# Patient Record
Sex: Male | Born: 1995 | Race: White | Hispanic: No | Marital: Single | Smoking: Current every day smoker
Health system: Southern US, Community
[De-identification: ages and names within clinical notes are randomized; demographics above are authoritative.]

---
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
## 2017-04-07 ENCOUNTER — Emergency Department (HOSPITAL_COMMUNITY): Payer: No Typology Code available for payment source

## 2017-04-07 ENCOUNTER — Observation Stay (HOSPITAL_COMMUNITY)
Admission: EM | Admit: 2017-04-07 | Discharge: 2017-04-08 | Disposition: A | Discharge status: Home / Self Care | Payer: No Typology Code available for payment source | Attending: Oral Surgery | Admitting: Oral Surgery

## 2017-04-07 ENCOUNTER — Encounter (HOSPITAL_COMMUNITY): Payer: Self-pay | Admitting: Emergency Medicine

## 2017-04-07 DIAGNOSIS — S2231XA Fracture of one rib, right side, initial encounter for closed fracture: Secondary | ICD-10-CM | POA: Insufficient documentation

## 2017-04-07 DIAGNOSIS — S02611A Fracture of condylar process of right mandible, initial encounter for closed fracture: Secondary | ICD-10-CM | POA: Insufficient documentation

## 2017-04-07 DIAGNOSIS — Y9344 Activity, trampolining: Secondary | ICD-10-CM | POA: Insufficient documentation

## 2017-04-07 DIAGNOSIS — S02609B Fracture of mandible, unspecified, initial encounter for open fracture: Secondary | ICD-10-CM

## 2017-04-07 DIAGNOSIS — S0292XB Unspecified fracture of facial bones, initial encounter for open fracture: Secondary | ICD-10-CM | POA: Diagnosis present

## 2017-04-07 DIAGNOSIS — S0181XA Laceration without foreign body of other part of head, initial encounter: Secondary | ICD-10-CM | POA: Insufficient documentation

## 2017-04-07 DIAGNOSIS — S02642A Fracture of ramus of left mandible, initial encounter for closed fracture: Secondary | ICD-10-CM | POA: Diagnosis not present

## 2017-04-07 DIAGNOSIS — S02651B Fracture of angle of right mandible, initial encounter for open fracture: Secondary | ICD-10-CM

## 2017-04-07 DIAGNOSIS — S0292XA Unspecified fracture of facial bones, initial encounter for closed fracture: Secondary | ICD-10-CM | POA: Diagnosis present

## 2017-04-07 DIAGNOSIS — F17290 Nicotine dependence, other tobacco product, uncomplicated: Secondary | ICD-10-CM | POA: Diagnosis not present

## 2017-04-07 DIAGNOSIS — S0266XA Fracture of symphysis of mandible, initial encounter for closed fracture: Secondary | ICD-10-CM | POA: Diagnosis not present

## 2017-04-07 DIAGNOSIS — S0269XA Fracture of mandible of other specified site, initial encounter for closed fracture: Secondary | ICD-10-CM

## 2017-04-07 LAB — BASIC METABOLIC PANEL
ANION GAP: 7 (ref 5–15)
BUN: 16 mg/dL (ref 6–20)
CHLORIDE: 101 mmol/L (ref 101–111)
CO2: 29 mmol/L (ref 22–32)
Calcium: 9.5 mg/dL (ref 8.9–10.3)
Creatinine, Ser: 1.12 mg/dL (ref 0.61–1.24)
GFR calc non Af Amer: >60 mL/min (ref >60)
GLUCOSE: 97 mg/dL (ref 65–99)
Potassium: 4.1 mmol/L (ref 3.5–5.1)
SODIUM: 137 mmol/L (ref 135–145)

## 2017-04-07 LAB — CBC WITH DIFFERENTIAL/PLATELET
BASOS ABS: 0 10*3/uL (ref 0.0–0.1)
BASOS PCT: 0 %
EOS ABS: 0 10*3/uL (ref 0.0–0.7)
EOS PCT: 0 %
HCT: 42.1 % (ref 39.0–52.0)
Hemoglobin: 14.2 g/dL (ref 13.0–17.0)
Lymphocytes Relative: 11 %
Lymphs Abs: 1.5 10*3/uL (ref 0.7–4.0)
MCH: 28.8 pg (ref 26.0–34.0)
MCHC: 33.7 g/dL (ref 30.0–36.0)
MCV: 85.4 fL (ref 78.0–100.0)
MONO ABS: 1.2 10*3/uL — AB (ref 0.1–1.0)
MONOS PCT: 9 %
NEUTROS ABS: 11 10*3/uL — AB (ref 1.7–7.7)
Neutrophils Relative %: 80 %
PLATELETS: 270 10*3/uL (ref 150–400)
RBC: 4.93 MIL/uL (ref 4.22–5.81)
RDW: 13.6 % (ref 11.5–15.5)
WBC: 13.7 10*3/uL — ABNORMAL HIGH (ref 4.0–10.5)

## 2017-04-07 MED ORDER — MORPHINE SULFATE (PF) 4 MG/ML IV SOLN
4.0000 mg | Freq: Once | INTRAVENOUS | Status: AC
  Administered 2017-04-07: 4 mg via INTRAVENOUS
  Filled 2017-04-07: qty 1

## 2017-04-07 MED ORDER — SODIUM CHLORIDE 0.9 % IV BOLUS (SEPSIS)
1000.0000 mL | Freq: Once | INTRAVENOUS | Status: AC
  Administered 2017-04-07: 1000 mL via INTRAVENOUS

## 2017-04-07 MED ORDER — OXYCODONE-ACETAMINOPHEN 5-325 MG PO TABS
ORAL_TABLET | ORAL | Status: AC
  Filled 2017-04-07: qty 1

## 2017-04-07 MED ORDER — SODIUM CHLORIDE 0.9 % IV SOLN
3.0000 g | Freq: Once | INTRAVENOUS | Status: AC
  Administered 2017-04-08: 3 g via INTRAVENOUS
  Filled 2017-04-07: qty 3

## 2017-04-07 MED ORDER — OXYCODONE-ACETAMINOPHEN 5-325 MG PO TABS
1.0000 | ORAL_TABLET | ORAL | Status: AC | PRN
  Administered 2017-04-07 – 2017-04-08 (×2): 1 via ORAL
  Filled 2017-04-07: qty 1

## 2017-04-07 NOTE — ED Triage Notes (Signed)
Reports jumping at a trampoline park today when he came down and hit a metal beam.  Lac noted to chin, pain in right jaw.  No LOC.

## 2017-04-07 NOTE — ED Provider Notes (Signed)
[facility]-EMERGENCY DEPT Provider Note   CSN: [id number] Arrival date & time: 04/07/17  1859     History   Chief Complaint Chief Complaint  Patient presents with  . Facial Laceration  . Jaw Pain    HPI John Edwards is a 21 y.o. male.  HPI   21 year old male presenting for evaluation of a recent jaw injury.  Pt was at a trampoline park with his fraternity earlier today.  Pt was doing flips from the trampoline to a foam pit when he jumped too far and landed with the chin on the hard edge of the foam pit.  Report acute onset of severe sharp pain to his chin, and transient blurry vision but no LOC.  Denies neck pain, and no other significant injury.  Did report mild muscle spasm to both shoulders.  Does report mild tenderness to R anterior chest at the sterno-clavicular joint.  Denies prior hx of head injury.  This incident happened approximately 3 hrs ago.  Does report mild headache.  Pt did received 1 percocet for pain in triage.  He is UTD with tetanus.     History reviewed. No pertinent past medical history.  There are no active problems to display for this patient.   History reviewed. No pertinent surgical history.     Home Medications    Prior to Admission medications   Not on File    Family History No family history on file.  Social History Social History  Substance Use Topics  . Smoking status: Current Every Day Smoker    Types: E-cigarettes  . Smokeless tobacco: Never Used  . Alcohol use Yes     Allergies   Patient has no allergy information on record.   Review of Systems Review of Systems  All other systems reviewed and are negative.    Physical Exam Updated Vital Signs BP (!) 146/82   Pulse 90   Temp 98.3 F (36.8 C) (Oral)   Resp 20   Ht 6' (1.829 m)   Wt 84.8 kg (187 lb)   SpO2 100%   BMI 25.36 kg/m   Physical Exam  Constitutional: He appears well-developed and well-nourished. No distress.  HENT:  Tenderness throughout entire jaw,  most significant to R jaw line, anterior chin.  2cm horizontal deep laceration to anterior chin, laceration noted to inner jaw between central incisor with loose tooth.  Trismus noted.  Difficult to fully evaluate the oral cavity due to trismus. No hemotympanum or septal hematoma. No midface tenderness.   Eyes: Conjunctivae are normal.  Neck: Neck supple.  Neurological: He is alert.  Skin: No rash noted.  Psychiatric: He has a normal mood and affect.  Nursing note and vitals reviewed.    ED Treatments / Results  Labs (all labs ordered are listed, but only abnormal results are displayed) Labs Reviewed  CBC WITH DIFFERENTIAL/PLATELET - Abnormal; Notable for the following:       Result Value   WBC 13.7 (*)    Neutro Abs 11.0 (*)    Monocytes Absolute 1.2 (*)    All other components within normal limits  BASIC METABOLIC PANEL    EKG  EKG Interpretation None       Radiology Ct Maxillofacial Wo Contrast  Result Date: 04/07/2017 CLINICAL DATA:  Per ed notes: Reports jumping at a trampoline park today when he came down and hit a metal beam. Lac noted to chin, pain in right jaw. No LOC EXAM: CT MAXILLOFACIAL WITHOUT CONTRAST  TECHNIQUE: Multidetector CT imaging of the maxillofacial structures was performed. Multiplanar CT image reconstructions were also generated. COMPARISON:  None. FINDINGS: Osseous: There is minimally displaced fracture through the symphysis of the mandible. Fracture extends from the alveolar ridge adjacent to the LEFT incisors and extends inferiorly slightly RIGHT of midline to the inferior aspect of the mandibular symphysis (images 23 through 10 of series 4). There is additional fracture through the body of the LEFT mandibular ramus approximately 2.5 cm from the inferior from the mandibular condyle. The mandibular condyle is located. RIGHT mandibular condyle is fractured and dislocated medially (image 53, series 4). Mild comminution The maxillary bone is intact. Pterygoid  plates are normal. Is no fluid in the maxillary sinuses. The orbital rims are intact. No intraconal injury. The zygomatic arches are intact. Orbits: Normal Sinuses: Normal Soft tissues: Gas within the sub lingular tissue on the RIGHT (image 8, series 3 Limited intracranial: No intracranial hemorrhage. IMPRESSION: 1. Minimally displaced fracture through the symphysis of the mandible. 2. Fracture of the RIGHT mandibular condyle with dislocation. 3. Fracture of the body of the LEFT mandibular ramus several cm below the mandibular condyle. Electronically Signed   By: Genevive Bi M.D.   On: 04/07/2017 20:07    Procedures Procedures (including critical care time)  Medications Ordered in ED Medications  oxyCODONE-acetaminophen (PERCOCET/ROXICET) 5-325 MG per tablet 1 tablet (1 tablet Oral Given 04/07/17 1924)  oxyCODONE-acetaminophen (PERCOCET/ROXICET) 5-325 MG per tablet (not administered)  sodium chloride 0.9 % bolus 1,000 mL (not administered)  morphine 4 MG/ML injection 4 mg (not administered)     Initial Impression / Assessment and Plan / ED Course  I have reviewed the triage vital signs and the nursing notes.  Pertinent labs & imaging results that were available during my care of the patient were reviewed by me and considered in my medical decision making (see chart for details).     BP 129/71   Pulse 75   Temp 98.3 F (36.8 C) (Oral)   Resp 18   Ht 6' (1.829 m)   Wt 84.8 kg (187 lb)   SpO2 100%   BMI 25.36 kg/m    Final Clinical Impressions(s) / ED Diagnoses   Final diagnoses:  Open fracture of right mandibular angle, initial encounter (HCC)  Jaw fracture, open, initial encounter [facility])    New Prescriptions Current Discharge Medication List      10:41 PM Pt here for evaluation of jaw injury after jumping off from a trampoline and struck jaw against hard surface.  CT scan showing minimally displaced fracture through the symphysis of the mandible, fracture of the R  mandibular condyle with dislocation and fracture of the body of the L mandibular ramus several cm below the mandibular condyle.    Since pt has open unstable fractures of his jaw, I have consulted oral surgeon Dr. Kenney Houseman who agrees to see pt in the ER and will admit for further surgical management.  Pt started on Unasyn as treatment for open fracture, pain medication given.  Pt made NPO.    11:13 PM Xray of R ribs demonstrate fracture of the right first rib without evidence of pneumothorax.  Definitive care for rib fracture including incentive spirometry was given.     Fayrene Helper, PA-C 04/08/17 1502    Tilden Fossa, MD 04/09/17 1440

## 2017-04-07 NOTE — H&P (Signed)
John Edwards is an 21 y.o. male.   Chief Complaint: jaw fracture/pain HPI: Patient is 21 year old male status post fall from trampoline landing on a metal bar earlier today. Denies loss of consciousness. He presented to [facility] ED for evaluation. CT scan of the maxillofacial structures was obtained showing multiple mandible fractures. Oral maxillofacial surgery was consult did for definitive care. Currently the patient complains of bilateral jaw pain. He denies nausea/vomiting. Denies dyspnea/dysphagia. Denies any cervical pain.  History reviewed. No pertinent past medical history.  History reviewed. No pertinent surgical history.  No family history on file. Social History:  reports that he has been smoking E-cigarettes.  He has never used smokeless tobacco. He reports that he drinks alcohol. He reports that he uses drugs, including Marijuana.  Allergies: No Known Allergies   (Not in a hospital admission)  Results for orders placed or performed during the hospital encounter of 04/07/17 (from the past 48 hour(s))  CBC with Differential     Status: Abnormal   Collection Time: 04/07/17  9:52 PM  Result Value Ref Range   WBC 13.7 (H) 4.0 - 10.5 K/uL   RBC 4.93 4.22 - 5.81 MIL/uL   Hemoglobin 14.2 13.0 - 17.0 g/dL   HCT 42.1 39.0 - 52.0 %   MCV 85.4 78.0 - 100.0 fL   MCH 28.8 26.0 - 34.0 pg   MCHC 33.7 30.0 - 36.0 g/dL   RDW 13.6 11.5 - 15.5 %   Platelets 270 150 - 400 K/uL   Neutrophils Relative % 80 %   Neutro Abs 11.0 (H) 1.7 - 7.7 K/uL   Lymphocytes Relative 11 %   Lymphs Abs 1.5 0.7 - 4.0 K/uL   Monocytes Relative 9 %   Monocytes Absolute 1.2 (H) 0.1 - 1.0 K/uL   Eosinophils Relative 0 %   Eosinophils Absolute 0.0 0.0 - 0.7 K/uL   Basophils Relative 0 %   Basophils Absolute 0.0 0.0 - 0.1 K/uL  Basic metabolic panel     Status: None   Collection Time: 04/07/17 10:20 PM  Result Value Ref Range   Sodium 137 135 - 145 mmol/L   Potassium 4.1 3.5 - 5.1 mmol/L   Chloride 101  101 - 111 mmol/L   CO2 29 22 - 32 mmol/L   Glucose, Bld 97 65 - 99 mg/dL   BUN 16 6 - 20 mg/dL   Creatinine, Ser 1.12 0.61 - 1.24 mg/dL   Calcium 9.5 8.9 - 10.3 mg/dL   GFR calc non Af Amer >60 >60 mL/min   GFR calc Af Amer >60 >60 mL/min    Comment: (NOTE) The eGFR has been calculated using the CKD EPI equation. This calculation has not been validated in all clinical situations. eGFR's persistently <60 mL/min signify possible Chronic Kidney Disease.    Anion gap 7 5 - 15   Dg Ribs Unilateral W/chest Right  Result Date: 04/07/2017 CLINICAL DATA:  Jumping on trampoline at trampoline park today, came down on metal beam, anterior right chest pain marked by bb on rib images. EXAM: RIGHT RIBS AND CHEST - 3+ VIEW COMPARISON:  None. FINDINGS: Normal cardiac silhouette. No pulmonary contusion or pneumothorax. Fracture of the RIGHT first rib. IMPRESSION: Fracture the RIGHT first rib.  No pneumothorax. Electronically Signed   By: Suzy Bouchard M.D.   On: 04/07/2017 22:57   Ct Maxillofacial Wo Contrast  Result Date: 04/07/2017 CLINICAL DATA:  Per ed notes: Reports jumping at a trampoline park today when he came down  and hit a metal beam. Lac noted to chin, pain in right jaw. No LOC EXAM: CT MAXILLOFACIAL WITHOUT CONTRAST TECHNIQUE: Multidetector CT imaging of the maxillofacial structures was performed. Multiplanar CT image reconstructions were also generated. COMPARISON:  None. FINDINGS: Osseous: There is minimally displaced fracture through the symphysis of the mandible. Fracture extends from the alveolar ridge adjacent to the LEFT incisors and extends inferiorly slightly RIGHT of midline to the inferior aspect of the mandibular symphysis (images 23 through 10 of series 4). There is additional fracture through the body of the LEFT mandibular ramus approximately 2.5 cm from the inferior from the mandibular condyle. The mandibular condyle is located. RIGHT mandibular condyle is fractured and dislocated  medially (image 53, series 4). Mild comminution The maxillary bone is intact. Pterygoid plates are normal. Is no fluid in the maxillary sinuses. The orbital rims are intact. No intraconal injury. The zygomatic arches are intact. Orbits: Normal Sinuses: Normal Soft tissues: Gas within the sub lingular tissue on the RIGHT (image 8, series 3 Limited intracranial: No intracranial hemorrhage. IMPRESSION: 1. Minimally displaced fracture through the symphysis of the mandible. 2. Fracture of the RIGHT mandibular condyle with dislocation. 3. Fracture of the body of the LEFT mandibular ramus several cm below the mandibular condyle. Electronically Signed   By: Suzy Bouchard M.D.   On: 04/07/2017 20:07    ROS : Other than that presented in the history of present illness, negative for 10 point review of system. Blood pressure 129/71, pulse 75, temperature 98.3 F (36.8 C), temperature source Oral, resp. rate 18, height 6' (1.829 m), weight 84.8 kg (187 lb), SpO2 100 %. Physical Exam  Gen: Awake/alert/oriented 3. No acute distress. HEENT: Pupils are equally round and reactive to light, extracted muscles are intact. He has mild bilateral mandibular edema with noted mandibular asymmetry. There is a 3 cm laceration in the mental area. There is a gross malocclusion noted with step-off defect in the anterior mandible consistent with mandible fracture and displacement. Oropharynx is clear. Mucous membranes moist. The anterior mandibular incisor slightly mobile. Hrt: rrr Lungs: cta-b Abd: s, nt, nd Neuro: Bilateral cranial nerve V3 paresthesia in the lip and chin area. Otherwise other cranial nerves are intact.   Assessment/Plan Minimally displaced open anterior mandibular symphysis fracture. Displaced fracture of the medial pole of the right condyle. Minimally displaced left subcondylar fracture.  Plan: The patient will be admitted and scheduled to be taken to the operating room and placed under general anesthesia  for open reduction internal fixation of anterior mandible fracture, closed reduction of bilateral condylar fractures with placement of arch bars and maxillomandibular fixation. Risks/benefits/alternatives been discussed with the patient in detail. The patient had an opportunity to ask questions. He will be admitted to the med surgical unit for IV fluids, antibiotics, and pain management in preparation for the OR. We'll remain nothing by mouth for now.  Michael Litter, DMD 04/07/2017, 11:53 PM

## 2017-04-08 ENCOUNTER — Inpatient Hospital Stay (HOSPITAL_COMMUNITY): Payer: No Typology Code available for payment source | Admitting: Certified Registered Nurse Anesthetist

## 2017-04-08 ENCOUNTER — Encounter (HOSPITAL_COMMUNITY)
Admission: EM | Disposition: A | Discharge status: Home / Self Care | Payer: Self-pay | Source: Home / Self Care | Attending: Oral Surgery

## 2017-04-08 ENCOUNTER — Inpatient Hospital Stay (HOSPITAL_COMMUNITY): Payer: No Typology Code available for payment source

## 2017-04-08 ENCOUNTER — Encounter (HOSPITAL_COMMUNITY): Payer: Self-pay

## 2017-04-08 DIAGNOSIS — S0292XB Unspecified fracture of facial bones, initial encounter for open fracture: Secondary | ICD-10-CM | POA: Diagnosis present

## 2017-04-08 HISTORY — PX: ORIF MANDIBULAR FRACTURE: SHX2127

## 2017-04-08 SURGERY — OPEN REDUCTION INTERNAL FIXATION (ORIF) MANDIBULAR FRACTURE
Anesthesia: General | Site: Mouth

## 2017-04-08 MED ORDER — HYDROCODONE-ACETAMINOPHEN 7.5-325 MG/15ML PO SOLN: 15.0000 mL | ORAL | 0 refills | Status: AC | PRN

## 2017-04-08 MED ORDER — LACTATED RINGERS IV SOLN
INTRAVENOUS | Status: DC
  Administered 2017-04-08: 11:00:00 via INTRAVENOUS

## 2017-04-08 MED ORDER — GELATIN ADSORBABLE OP FILM
ORAL_FILM | OPHTHALMIC | Status: AC
  Filled 2017-04-08: qty 1

## 2017-04-08 MED ORDER — PROPOFOL 10 MG/ML IV BOLUS
INTRAVENOUS | Status: DC | PRN
  Administered 2017-04-08: 200 mg via INTRAVENOUS

## 2017-04-08 MED ORDER — ONDANSETRON HCL 4 MG/2ML IJ SOLN
INTRAMUSCULAR | Status: AC
  Filled 2017-04-08: qty 2

## 2017-04-08 MED ORDER — FENTANYL CITRATE (PF) 100 MCG/2ML IJ SOLN
INTRAMUSCULAR | Status: DC | PRN
  Administered 2017-04-08: 50 ug via INTRAVENOUS
  Administered 2017-04-08: 100 ug via INTRAVENOUS
  Administered 2017-04-08 (×3): 50 ug via INTRAVENOUS

## 2017-04-08 MED ORDER — DEXMEDETOMIDINE HCL IN NACL 200 MCG/50ML IV SOLN
INTRAVENOUS | Status: AC
  Filled 2017-04-08: qty 50

## 2017-04-08 MED ORDER — PROMETHAZINE HCL 25 MG/ML IJ SOLN
6.2500 mg | INTRAMUSCULAR | Status: DC | PRN
  Administered 2017-04-08: 6.25 mg via INTRAVENOUS

## 2017-04-08 MED ORDER — LIDOCAINE-EPINEPHRINE 1 %-1:100000 IJ SOLN
INTRAMUSCULAR | Status: DC | PRN
  Administered 2017-04-08: 18 mL

## 2017-04-08 MED ORDER — BUPIVACAINE-EPINEPHRINE (PF) 0.5% -1:200000 IJ SOLN
INTRAMUSCULAR | Status: DC | PRN
  Administered 2017-04-08: 20 mL

## 2017-04-08 MED ORDER — ROCURONIUM BROMIDE 100 MG/10ML IV SOLN
INTRAVENOUS | Status: DC | PRN
  Administered 2017-04-08: 30 mg via INTRAVENOUS

## 2017-04-08 MED ORDER — DEXAMETHASONE SODIUM PHOSPHATE 10 MG/ML IJ SOLN
10.0000 mg | Freq: Three times a day (TID) | INTRAMUSCULAR | Status: DC
  Administered 2017-04-08 (×2): 10 mg via INTRAVENOUS
  Filled 2017-04-08 (×2): qty 1

## 2017-04-08 MED ORDER — SUCCINYLCHOLINE CHLORIDE 20 MG/ML IJ SOLN
INTRAMUSCULAR | Status: DC | PRN
  Administered 2017-04-08: 100 mg via INTRAVENOUS

## 2017-04-08 MED ORDER — FENTANYL CITRATE (PF) 250 MCG/5ML IJ SOLN
INTRAMUSCULAR | Status: AC
  Filled 2017-04-08: qty 5

## 2017-04-08 MED ORDER — PHENYLEPHRINE 40 MCG/ML (10ML) SYRINGE FOR IV PUSH (FOR BLOOD PRESSURE SUPPORT)
PREFILLED_SYRINGE | INTRAVENOUS | Status: DC | PRN
  Administered 2017-04-08 (×7): 80 ug via INTRAVENOUS

## 2017-04-08 MED ORDER — ONDANSETRON HCL 4 MG/2ML IJ SOLN
INTRAMUSCULAR | Status: DC | PRN
  Administered 2017-04-08: 4 mg via INTRAVENOUS

## 2017-04-08 MED ORDER — ROCURONIUM BROMIDE 10 MG/ML (PF) SYRINGE
PREFILLED_SYRINGE | INTRAVENOUS | Status: AC
  Filled 2017-04-08: qty 5

## 2017-04-08 MED ORDER — SODIUM CHLORIDE 0.9 % IV SOLN
INTRAVENOUS | Status: DC
  Administered 2017-04-08: 02:00:00 via INTRAVENOUS

## 2017-04-08 MED ORDER — HYDROMORPHONE HCL 1 MG/ML IJ SOLN
0.2500 mg | INTRAMUSCULAR | Status: DC | PRN
  Administered 2017-04-08: 0.5 mg via INTRAVENOUS

## 2017-04-08 MED ORDER — BACITRACIN ZINC 500 UNIT/GM EX OINT
TOPICAL_OINTMENT | CUTANEOUS | Status: AC
  Filled 2017-04-08: qty 28.35

## 2017-04-08 MED ORDER — BSS IO SOLN
INTRAOCULAR | Status: AC
  Filled 2017-04-08: qty 15

## 2017-04-08 MED ORDER — LIDOCAINE HCL (CARDIAC) 20 MG/ML IV SOLN
INTRAVENOUS | Status: DC | PRN
  Administered 2017-04-08: 100 mg via INTRAVENOUS

## 2017-04-08 MED ORDER — SUGAMMADEX SODIUM 200 MG/2ML IV SOLN
INTRAVENOUS | Status: AC
  Filled 2017-04-08: qty 2

## 2017-04-08 MED ORDER — MIDAZOLAM HCL 2 MG/2ML IJ SOLN
INTRAMUSCULAR | Status: AC
  Filled 2017-04-08: qty 2

## 2017-04-08 MED ORDER — HYDROMORPHONE HCL 1 MG/ML IJ SOLN
INTRAMUSCULAR | Status: AC
  Filled 2017-04-08: qty 1

## 2017-04-08 MED ORDER — AMOXICILLIN-POT CLAVULANATE 250-62.5 MG/5ML PO SUSR: 875.0000 mg | Freq: Two times a day (BID) | ORAL | 0 refills | Status: AC

## 2017-04-08 MED ORDER — PROMETHAZINE HCL 25 MG/ML IJ SOLN
INTRAMUSCULAR | Status: AC
  Filled 2017-04-08: qty 1

## 2017-04-08 MED ORDER — SUGAMMADEX SODIUM 200 MG/2ML IV SOLN
INTRAVENOUS | Status: DC | PRN
  Administered 2017-04-08: 170 mg via INTRAVENOUS

## 2017-04-08 MED ORDER — AMPICILLIN-SULBACTAM SODIUM 3 (2-1) G IJ SOLR
3.0000 g | Freq: Four times a day (QID) | INTRAMUSCULAR | Status: DC
  Administered 2017-04-08 (×2): 3 g via INTRAVENOUS
  Filled 2017-04-08 (×5): qty 3

## 2017-04-08 MED ORDER — DEXAMETHASONE SODIUM PHOSPHATE 10 MG/ML IJ SOLN
INTRAMUSCULAR | Status: AC
  Filled 2017-04-08: qty 1

## 2017-04-08 MED ORDER — CHLORHEXIDINE GLUCONATE 0.12 % MT SOLN: OROMUCOSAL | 3 refills | Status: DC

## 2017-04-08 MED ORDER — DEXMEDETOMIDINE HCL 200 MCG/2ML IV SOLN
INTRAVENOUS | Status: DC | PRN
  Administered 2017-04-08: 4 ug via INTRAVENOUS
  Administered 2017-04-08: 8 ug via INTRAVENOUS

## 2017-04-08 MED ORDER — DEXAMETHASONE SODIUM PHOSPHATE 10 MG/ML IJ SOLN
INTRAMUSCULAR | Status: DC | PRN
  Administered 2017-04-08: 10 mg via INTRAVENOUS

## 2017-04-08 MED ORDER — CHLORHEXIDINE GLUCONATE 0.12 % MT SOLN: OROMUCOSAL | 3 refills | Status: AC

## 2017-04-08 MED ORDER — ONDANSETRON HCL 4 MG/2ML IJ SOLN
4.0000 mg | Freq: Four times a day (QID) | INTRAMUSCULAR | Status: DC | PRN
  Administered 2017-04-08: 4 mg via INTRAVENOUS

## 2017-04-08 MED ORDER — LIDOCAINE-EPINEPHRINE 1 %-1:100000 IJ SOLN
INTRAMUSCULAR | Status: AC
  Filled 2017-04-08: qty 1

## 2017-04-08 MED ORDER — IBUPROFEN 600 MG PO TABS: 600.0000 mg | ORAL_TABLET | Freq: Four times a day (QID) | ORAL | 1 refills | Status: AC | PRN

## 2017-04-08 MED ORDER — MIDAZOLAM HCL 5 MG/5ML IJ SOLN
INTRAMUSCULAR | Status: DC | PRN
  Administered 2017-04-08: 2 mg via INTRAVENOUS

## 2017-04-08 MED ORDER — BUPIVACAINE-EPINEPHRINE (PF) 0.5% -1:200000 IJ SOLN
INTRAMUSCULAR | Status: AC
  Filled 2017-04-08: qty 30

## 2017-04-08 MED ORDER — SUCCINYLCHOLINE CHLORIDE 200 MG/10ML IV SOSY
PREFILLED_SYRINGE | INTRAVENOUS | Status: AC
  Filled 2017-04-08: qty 10

## 2017-04-08 MED ORDER — MORPHINE SULFATE (PF) 4 MG/ML IV SOLN
2.0000 mg | INTRAVENOUS | Status: DC | PRN
  Administered 2017-04-08: 4 mg via INTRAVENOUS
  Administered 2017-04-08: 2 mg via INTRAVENOUS
  Administered 2017-04-08 (×2): 4 mg via INTRAVENOUS
  Filled 2017-04-08 (×4): qty 1

## 2017-04-08 MED ORDER — 0.9 % SODIUM CHLORIDE (POUR BTL) OPTIME
TOPICAL | Status: DC | PRN
  Administered 2017-04-08: 1000 mL

## 2017-04-08 SURGICAL SUPPLY — 46 items
BIT DRILL RAINBOW 1.6X35 (BIT) IMPLANT
BIT DRILL TWIST 1.6X58MM (BIT) IMPLANT
BLADE SURG 15 STRL LF DISP TIS (BLADE) IMPLANT
BLADE SURG 15 STRL SS (BLADE) ×3
BURR 6MM HEAD ROUND DIAMOND (BURR) ×2 IMPLANT
CANISTER SUCT 3000ML PPV (MISCELLANEOUS) ×2 IMPLANT
CLEANER TIP ELECTROSURG 2X2 (MISCELLANEOUS) ×2 IMPLANT
DRAPE HALF SHEET 40X57 (DRAPES) ×2 IMPLANT
DRILL TWIST 1.6X58MM (BIT) ×3
DRSG TELFA 3X8 NADH (GAUZE/BANDAGES/DRESSINGS) ×3 IMPLANT
ELECT REM PT RETURN 9FT ADLT (ELECTROSURGICAL) ×3
ELECTRODE REM PT RTRN 9FT ADLT (ELECTROSURGICAL) IMPLANT
GLOVE BIO SURGEON STRL SZ 6.5 (GLOVE) ×3 IMPLANT
GLOVE BIO SURGEONS STRL SZ 6.5 (GLOVE) ×3
GLOVE BIOGEL M 7.0 STRL (GLOVE) ×2 IMPLANT
GLOVE BIOGEL PI IND STRL 7.0 (GLOVE) IMPLANT
GLOVE BIOGEL PI INDICATOR 7.0 (GLOVE) ×6
GOWN STRL REUS W/ TWL LRG LVL3 (GOWN DISPOSABLE) IMPLANT
GOWN STRL REUS W/TWL LRG LVL3 (GOWN DISPOSABLE) ×9
KIT BASIN OR (CUSTOM PROCEDURE TRAY) ×2 IMPLANT
KIT ROOM TURNOVER OR (KITS) ×2 IMPLANT
PAD ARMBOARD 7.5X6 YLW CONV (MISCELLANEOUS) ×2 IMPLANT
PAD DRESSING TELFA 3X8 NADH (GAUZE/BANDAGES/DRESSINGS) IMPLANT
PENCIL BUTTON HOLSTER BLD 10FT (ELECTRODE) ×2 IMPLANT
PLATE 6 H FRACTURE W/BAR (Plate) ×2 IMPLANT
PLATE HYBRID MMF SM (Plate) ×4 IMPLANT
SCISSORS WIRE DISP (INSTRUMENTS) ×2 IMPLANT
SCREW BONE CROSS PIN 2.0X10MM (Screw) ×2 IMPLANT
SCREW BONE CROSS PIN 2.0X12MM (Screw) ×4 IMPLANT
SCREW LOCK SELFDRIL 2.0X8M MMF (Screw) ×18 IMPLANT
SCREW LOCKING SELF DRILL 2.0X6 (Screw) ×18 IMPLANT
SCREW MNDBLE 2.3X12MM BONE (Screw) ×2 IMPLANT
SOL PREP POV-IOD 4OZ 10% (MISCELLANEOUS) ×2 IMPLANT
STAPLER VISISTAT 35W (STAPLE) IMPLANT
SURGICAL STEEL DS 26 ×4 IMPLANT
SUT CHROMIC 3 0 PS 2 (SUTURE) ×6 IMPLANT
SUT PROLENE 5 0 PS 2 (SUTURE) ×2 IMPLANT
SUT STEEL 2 (SUTURE) ×4 IMPLANT
SUT VIC AB 4-0 P-3 18X BRD (SUTURE) IMPLANT
SUT VIC AB 4-0 P3 18 (SUTURE) ×3
SYRINGE 60CC LL (MISCELLANEOUS) ×2 IMPLANT
TRAY ENT MC OR (CUSTOM PROCEDURE TRAY) ×2 IMPLANT
TUBE CONNECTING 12'X1/4 (SUCTIONS) ×1
TUBE CONNECTING 12X1/4 (SUCTIONS) ×1 IMPLANT
WATER STERILE IRR 1000ML POUR (IV SOLUTION) ×2 IMPLANT
YANKAUER SUCT BULB TIP NO VENT (SUCTIONS) ×2 IMPLANT

## 2017-04-08 NOTE — Anesthesia Procedure Notes (Signed)
Procedure Name: Intubation Date/Time: 04/08/2017 1:02 PM Performed by: Sharlene Dory E Pre-anesthesia Checklist: Patient identified, Emergency Drugs available, Suction available and Patient being monitored Patient Re-evaluated:Patient Re-evaluated prior to induction Oxygen Delivery Method: Circle system utilized Preoxygenation: Pre-oxygenation with 100% oxygen Induction Type: IV induction and Rapid sequence Laryngoscope Size: Glidescope and 4 Nasal Tubes: Right and Nasal Rae Tube size: 7.0 mm Number of attempts: 1 Placement Confirmation: ETT inserted through vocal cords under direct vision,  positive ETCO2 and breath sounds checked- equal and bilateral Tube secured with: Tape Dental Injury: Teeth and Oropharynx as per pre-operative assessment  Comments: Grade I view on Glidescope screen.

## 2017-04-08 NOTE — Transfer of Care (Signed)
Immediate Anesthesia Transfer of Care Note  Patient: John Edwards  Procedure(s) Performed: Procedure(s): OPEN REDUCTION INTERNAL FIXATION (ORIF) ANTERIOR MANDIBULAR FRACTURE,CLOSE REDUCTION OF BILATERAL CONDYLE FRACTURES, PLACEMENT OF ARCH BARS, USE OF MAXILLOMANDIBULLAR FIXATION (N/A)  Patient Location: PACU  Anesthesia Type:General  Level of Consciousness: awake and patient cooperative  Airway & Oxygen Therapy: Patient Spontanous Breathing  Post-op Assessment: Report given to RN and Post -op Vital signs reviewed and stable  Post vital signs: Reviewed and stable  Last Vitals:  Vitals:   04/08/17 0544 04/08/17 0900  BP: 122/61 (!) 130/58  Pulse: 67 88  Resp: 16 18  Temp: 36.6 C 36.8 C  SpO2: 100% 100%    Last Pain:  Vitals:   04/08/17 0900  TempSrc: Oral  PainSc:       Patients Stated Pain Goal: 3 (04/08/17 0110)  Complications: No apparent anesthesia complications

## 2017-04-08 NOTE — Progress Notes (Signed)
Received pt per stretcher. Ambulated to room. Alert and oriented. Oriented with the room and use of call light.

## 2017-04-08 NOTE — Brief Op Note (Signed)
04/07/2017 - 04/08/2017  4:05 PM  PATIENT:  John Edwards  21 y.o. male  PRE-OPERATIVE DIAGNOSIS:  MULTIPLE MANDIBLE FX'S  POST-OPERATIVE DIAGNOSIS:  MULTIPLE MANDIBLE FX'S  PROCEDURE:  Procedure(s): OPEN REDUCTION INTERNAL FIXATION (ORIF) ANTERIOR MANDIBULAR FRACTURE,CLOSE REDUCTION OF BILATERAL CONDYLE FRACTURES, PLACEMENT OF ARCH BARS, USE OF MAXILLOMANDIBULLAR FIXATION (N/A)  SURGEON:  Surgeon(s) and Role:    * Jahmiya Guidotti, Jill Alexanders, DMD - Primary    ASSISTANTS: Lequita Halt Arlege, Camila Li  ANESTHESIA:   general  EBL:  25cc IVF: 2L  BLOOD ADMINISTERED:none  DRAINS: none   LOCAL MEDICATIONS USED:  BUPIVICAINE   SPECIMEN:  No Specimen  DISPOSITION OF SPECIMEN:  N/A  COUNTS:  YES  TOURNIQUET:  * No tourniquets in log *  DICTATION: .Dragon Dictation  PLAN OF CARE: Discharge to home after PACU  PATIENT DISPOSITION:  PACU - hemodynamically stable. Patient's jaws are wired shut.   Delay start of Pharmacological VTE agent (>24hrs) due to surgical blood loss or risk of bleeding: not applicable

## 2017-04-08 NOTE — Discharge Instructions (Signed)
1. Soft, non-chew pureed diet until further notice 2. Keep head elevated for first 3 nights 3. Brush teeth/arch bars/wires as normal 4. Clean chin wound twice daily with hydrogen peroxide on q-tip. Then coat with bacitracin ointment to keep moist. Sutures will be removed in 1 wk.

## 2017-04-08 NOTE — Op Note (Signed)
PATIENT:  John Edwards  21 y.o. male  PRE-OPERATIVE DIAGNOSIS:  MULTIPLE MANDIBLE FX'S  POST-OPERATIVE DIAGNOSIS:  MULTIPLE MANDIBLE FX'S  PROCEDURE:  Procedure(s): OPEN REDUCTION INTERNAL FIXATION (ORIF) ANTERIOR MANDIBULAR FRACTURE,CLOSE REDUCTION OF BILATERAL CONDYLE FRACTURES, PLACEMENT OF ARCH BARS, USE OF MAXILLOMANDIBULLAR FIXATION (N/A) Repair of complex 3cm chin laceration  SURGEON:  Surgeon(s) and Role:    * Liliana Dang, Jill Alexanders, DMD - Primary    ASSISTANTS: Madaline Savage, Camila Li  ANESTHESIA:   general  EBL:  25cc IVF: 2L  Complications: none  Findings: Mental nerve identified bilaterally during anterior dissection was intact and protected. Adequate reduction of anterior mandible fracture. Occlusion was stable in maxillomandibular fixation. Adequate overjet and overbite.  Procedure: The patient was identified in the preoperative holding by both anesthesia and the maxilla facial teams. Health history was reviewed. Consent was verified. The patient brought back to the operative room and placed in a table supine position standard ASA leads and monitors were placed. The patient was preoxygenated, induced, and his airway was protected with a nasoendotracheal tube. The tube was taped and secured by the anesthesia care team. A throat pack was placed. The patient was then prepped and draped for a maxilla facial procedure.  The Stryker hybrid arch bar was adapted to the maxillary arch and fixated in place with multiple 8 mm length Stryker screws. Next  Attention was then directed to the anterior mandible were standard genioplasty incision was performed in the mandibular labial vestibule. The incision was carried through the mucosa. Blunt dissection was carried out with the Metzenbaum scissors down to the periosteum. The periosteum was incised with a 15 blade. Subperiosteal dissection was carried out to the inferior border of the mandible and to expose the symphysis fracture. The  fracture line was debrided and the segments were mobilized. The site was thoroughly irrigated.  Next, the Stryker hybrid arch bar was then adapted to the mandibular arch. With aid of a bone clamp to reduce the anterior mandible fracture arch bar was then fixated in place with multiple 6 mm length Stryker screws. The left central incisor was partially mobile and was repositioned within the socket. Following this there is no appreciable step-off defect in the occlusion. The patient was then guided in placed into maxillomandibular fixation with wire loops. Next a 4 hole Stryker radicular trauma plate was adapted to the inferior border in the anterior mandible. The plate was over contoured to allow for compression. The plate was then fixated with multiple bicortical Stryker screws. The fixation was tested and noted to be stable. The patient was then released from maximum mandibular fixation. The occlusion was noted to be stable and reproducible. The anterior mandibular wound was then thoroughly irrigated with copious amounts of saline. The mentalis muscle was then reapproximated with multiple 4-0 Vicryl sutures. The mucosa was then closed with interrupted 3-0 chromic gut suture placed in continuous interlocking fashion. Of note, prior to mucosal closure the midline was reapproximated with a single 4-0 Vicryl suture.  At this point attention was directed to the's 3 cm chin laceration which went through and through to the inferior border the mandible. The wound was thoroughly irrigated and debrided. The periosteum was closed with interrupted 3-0 Vicryl sutures. The deep layers were reapproximated with multiple 4-0 Vicryl sutures. The skin was then reapproximated with 5-0 Prolene suture placed in a continuous fashion.  At this point the mouth was thoroughly irrigated and suctioned. Throat pack was removed. The patient was then placed again back into  maximum mandibular fixation with 4 wire loops. The occlusion  appeared stable. The extraoral wound was then cleansed and covered with a thin coat of bacitracin and Telfa dressing. The patient was then returned to the anesthesia care team where he was extubated without event. He was transported to the postanesthesia care for recovery. He'll be discharged home once meeting appropriate discharge criteria.

## 2017-04-08 NOTE — Anesthesia Postprocedure Evaluation (Signed)
Anesthesia Post Note  Patient: John Edwards  Procedure(s) Performed: Procedure(s) (LRB): OPEN REDUCTION INTERNAL FIXATION (ORIF) ANTERIOR MANDIBULAR FRACTURE,CLOSE REDUCTION OF BILATERAL CONDYLE FRACTURES, PLACEMENT OF ARCH BARS, USE OF MAXILLOMANDIBULLAR FIXATION (N/A)     Patient location during evaluation: PACU Anesthesia Type: General Level of consciousness: awake and alert Pain management: pain level controlled Vital Signs Assessment: post-procedure vital signs reviewed and stable Respiratory status: spontaneous breathing, nonlabored ventilation, respiratory function stable and patient connected to nasal cannula oxygen Cardiovascular status: blood pressure returned to baseline and stable Postop Assessment: no apparent nausea or vomiting Anesthetic complications: no    Last Vitals:  Vitals:   04/08/17 1745 04/08/17 1750  BP: 118/63 118/63  Pulse: 76 78  Resp: 13 13  Temp:  36.7 C  SpO2: 96% 95%    Last Pain:  Vitals:   04/08/17 1750  TempSrc:   PainSc: 6                  Kennieth Rad

## 2017-04-08 NOTE — Discharge Summary (Signed)
Physician Discharge Summary  Patient ID: John Edwards MRN: [id number] DOB/AGE: 09/11/1995 21 y.o.  Admit date: 04/07/2017 Discharge date: 04/08/2017  Admission Diagnoses: Mandibular fractures  Discharge Diagnoses:  Active Problems:   Multiple facial fractures, open, initial encounter [facility]) s/p repair of mandible fractures and chin laceration  Discharged Condition: good  Hospital Course: The patient was admitted due to multiple mandibular fractures. On 9/22, the patient underwent ORIF of mandibular symphysis fracture and closed reduction of bilateral condylar fractures with placement of arch bars and use of maxillomandibular fixation in the OR under GA. The patient awoke from GA and was extubated without complication. The patient was recovered in the PACU and transferred to the floor for initial postop monitoring. The pt did not have a fever with VSS, and pain was adequately controlled. The patient was discharged to home in good condition.  Consults: None  Significant Diagnostic Studies: radiology: CT scan: maxillofacial  Treatments: surgery: ORIF mand symphysis fx, CR bil condylar fx's. Repair of chin laceration.  Discharge Exam: Blood pressure 132/63, pulse 90, temperature 98.2 F (36.8 C), temperature source Axillary, resp. rate 16, height 6' (1.829 m), weight 84.8 kg (187 lb), SpO2 100 %. General appearance: alert and no distress  HEENT: occlusion stable, wounds intact  Disposition:   Discharge Instructions    Increase activity slowly    Complete by:  As directed      Allergies as of 04/08/2017   No Known Allergies     Medication List    STOP taking these medications   omega-3 acid ethyl esters 1 g capsule Commonly known as:  LOVAZA   OVER THE COUNTER MEDICATION   OVER THE COUNTER MEDICATION   TURMERIC PO   vitamin B-12 100 MCG tablet Commonly known as:  CYANOCOBALAMIN     TAKE these medications   amoxicillin-clavulanate 250-62.5 MG/5ML suspension Commonly  known as:  AUGMENTIN Take 17.5 mLs (875 mg total) by mouth 2 (two) times daily.   chlorhexidine 0.12 % solution Commonly known as:  PERIDEX Rinse with 15mL po tid for 2 minute duration. Expectorate after use.   HYDROcodone-acetaminophen 7.5-325 mg/15 ml solution Commonly known as:  HYCET Take 15 mLs by mouth every 4 (four) hours as needed for moderate pain or severe pain.   ibuprofen 600 MG tablet Commonly known as:  ADVIL,MOTRIN Take 1 tablet (600 mg total) by mouth every 6 (six) hours as needed for mild pain or moderate pain. Elixir substitution permitted.            Discharge Care Instructions        Start     Ordered   04/08/17 0000  Increase activity slowly   Soft, non-chew pureed diet until further notice   04/08/17 1633   04/08/17 0000  amoxicillin-clavulanate (AUGMENTIN) 250-62.5 MG/5ML suspension  2 times daily     04/08/17 1633   04/08/17 0000  ibuprofen (ADVIL,MOTRIN) 600 MG tablet  Every 6 hours PRN     04/08/17 1633   04/08/17 0000  HYDROcodone-acetaminophen (HYCET) 7.5-325 mg/15 ml solution  Every 4 hours PRN     04/08/17 1633   04/08/17 0000  chlorhexidine (PERIDEX) 0.12 % solution     04/08/17 1640     Follow-up Information    Huriel Matt, Jill Alexanders, DMD. Schedule an appointment as soon as possible for a visit in 1 week.   Specialty:  Dentistry Why:  For suture removal, For wound re-check Contact information: [address] [place] [address] [phone]  Signed: Vivia Ewing, DMD Oral & Maxillofacial Surgery 04/08/2017, 7:53 PM

## 2017-04-08 NOTE — Anesthesia Preprocedure Evaluation (Addendum)
Anesthesia Evaluation  Patient identified by MRN, date of birth, ID band Patient awake    Reviewed: Allergy & Precautions, NPO status , Patient's Chart, lab work & pertinent test results  Airway Mallampati: III     Mouth opening: Limited Mouth Opening  Dental  (+) Dental Advisory Given, Loose, Teeth Intact   Pulmonary Current Smoker,    Pulmonary exam normal        Cardiovascular negative cardio ROS   Rhythm:Regular Rate:Normal     Neuro/Psych negative neurological ROS     GI/Hepatic negative GI ROS, Neg liver ROS,   Endo/Other  negative endocrine ROS  Renal/GU negative Renal ROS     Musculoskeletal   Abdominal   Peds  Hematology negative hematology ROS (+)   Anesthesia Other Findings   Reproductive/Obstetrics                            Lab Results  Component Value Date   WBC 13.7 (H) 04/07/2017   HGB 14.2 04/07/2017   HCT 42.1 04/07/2017   MCV 85.4 04/07/2017   PLT 270 04/07/2017   Lab Results  Component Value Date   CREATININE 1.12 04/07/2017   BUN 16 04/07/2017   NA 137 04/07/2017   K 4.1 04/07/2017   CL 101 04/07/2017   CO2 29 04/07/2017    Anesthesia Physical Anesthesia Plan  ASA: I  Anesthesia Plan: General   Post-op Pain Management:    Induction: Intravenous  PONV Risk Score and Plan: 1 and Ondansetron, Dexamethasone and Treatment may vary due to age or medical condition  Airway Management Planned: Nasal ETT  Additional Equipment:   Intra-op Plan:   Post-operative Plan: Extubation in OR  Informed Consent: I have reviewed the patients History and Physical, chart, labs and discussed the procedure including the risks, benefits and alternatives for the proposed anesthesia with the patient or authorized representative who has indicated his/her understanding and acceptance.   Dental advisory given  Plan Discussed with: CRNA  Anesthesia Plan Comments:         Anesthesia Quick Evaluation

## 2017-04-08 NOTE — Progress Notes (Signed)
D/c home per orders pt should hove d/c from PACU.  VSS pain denied. RX and D/C instructions given and explained with understanding. Dr Kenney Houseman gave verbal order okay to go home without voiding.

## 2017-04-10 ENCOUNTER — Encounter (HOSPITAL_COMMUNITY): Payer: Self-pay | Admitting: Oral Surgery

## 2018-08-22 IMAGING — CT CT 3D ACQUISTION [facility]KST
3 series · 15 of 47 positions shown, 18 images · non-contrast
Comparison: none

CLINICAL DATA: Nonspecific (abnormal) findings on radiological and
other examination of musculoskeletal sysem. Mandibular fractures.

EXAM:
3-DIMENSIONAL CT IMAGE RENDERING ON ACQUISITION WORKSTATION
TECHNIQUE: 3-dimensional CT images were rendered by post-processing of the
original CT data on an acquisition workstation. The 3-dimensional CT
images were interpreted and findings were reported in the
accompanying complete CT report for this study

[Series 3: facial/ orbits 2.0 h30s · axial · 0.43mm/px · z∈[-162,-10]mm · 9 of 90 slices shown, 12 images]
[im 7/90  brain]
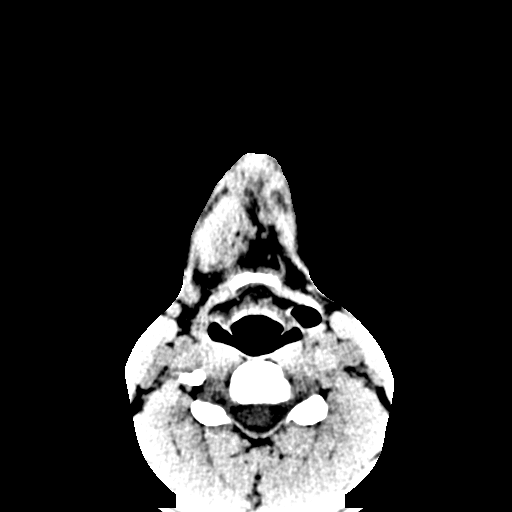
[im 7/90  bone]
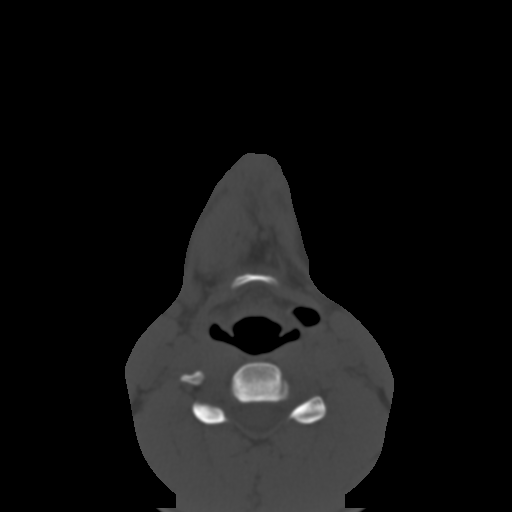
[im 16/90  brain]
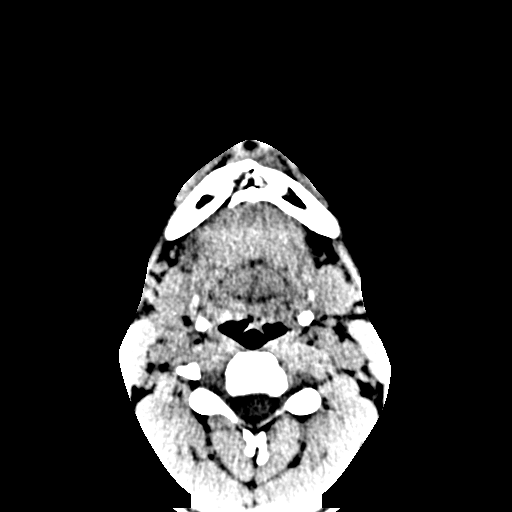
[im 25/90  brain]
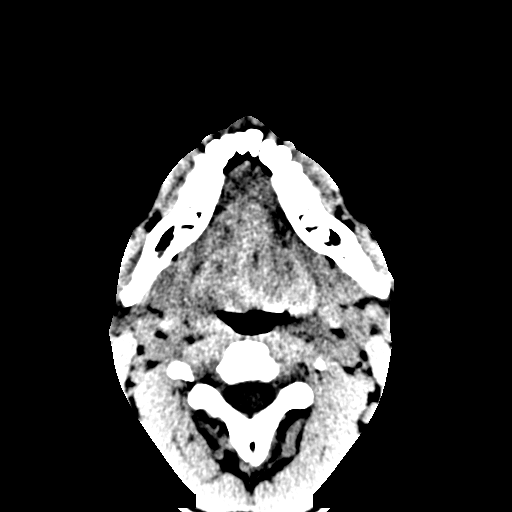
[im 34/90  brain]
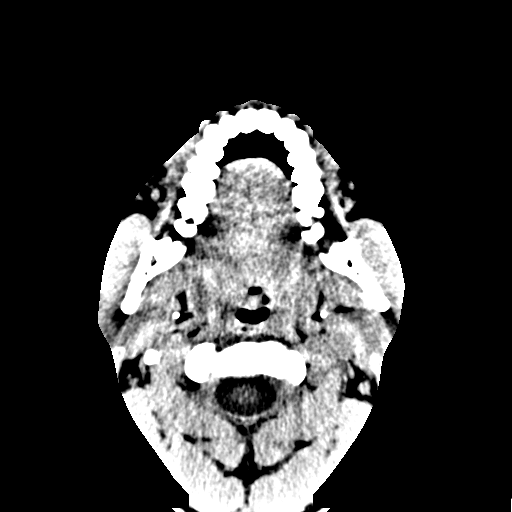
[im 47/90  brain]
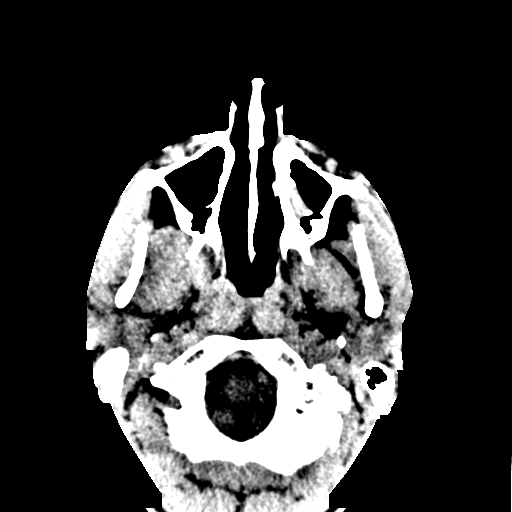
[im 47/90  bone]
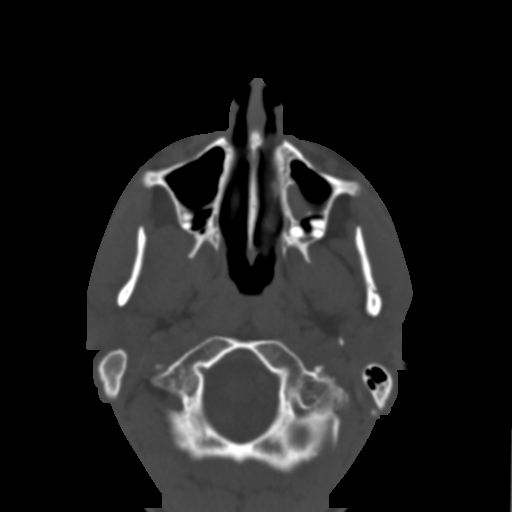
[im 56/90  brain]
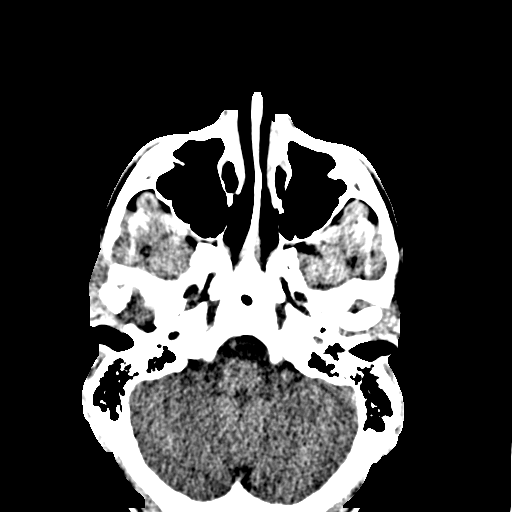
[im 65/90  brain]
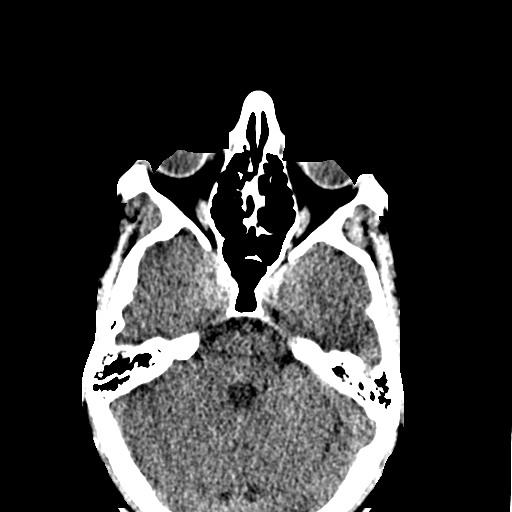
[im 74/90  brain]
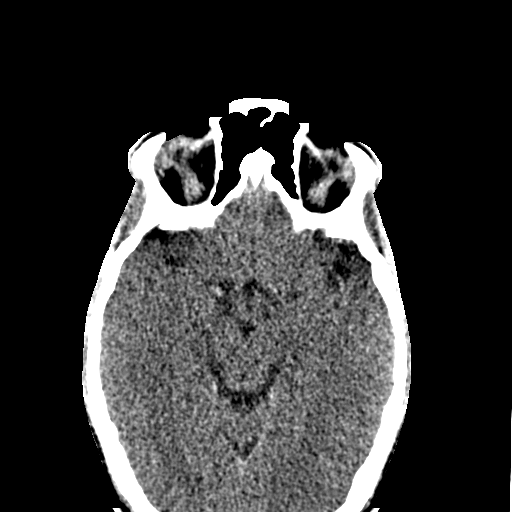
[im 83/90  brain]
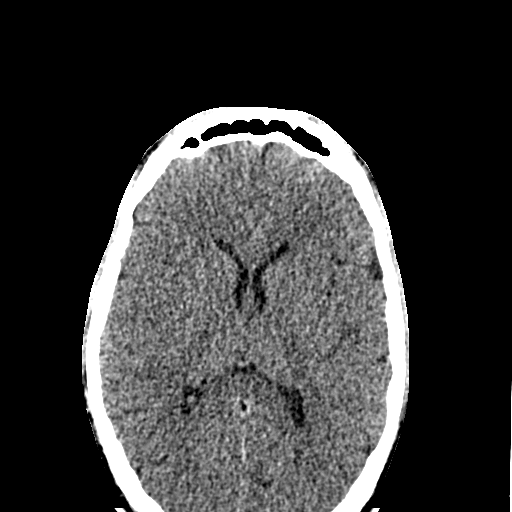
[im 83/90  bone]
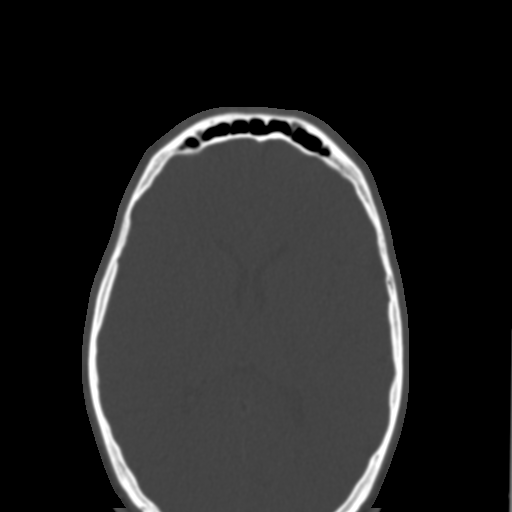

[Series 7: coronal soft tissue · coronal · 0.35mm/px · 3 of 120 slices shown]
[im 40/120  brain]
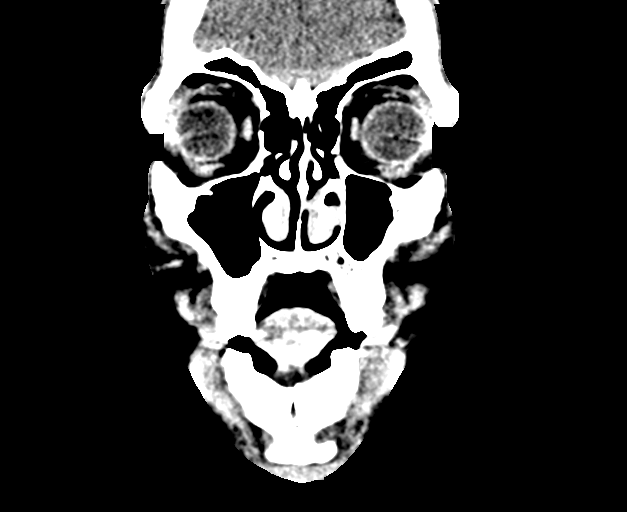
[im 53/120  brain]
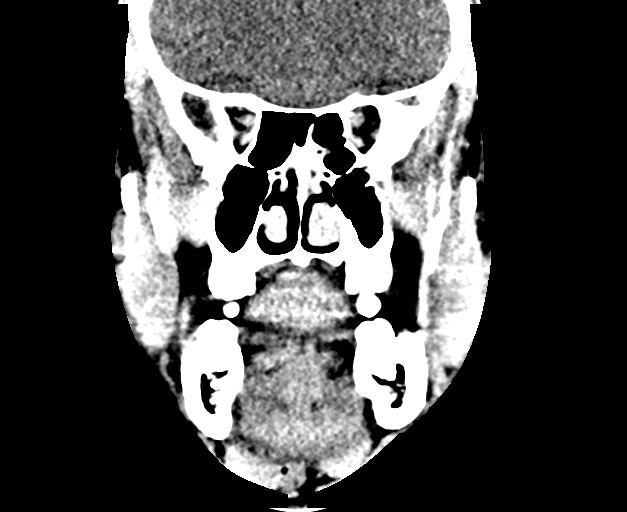
[im 67/120  brain]
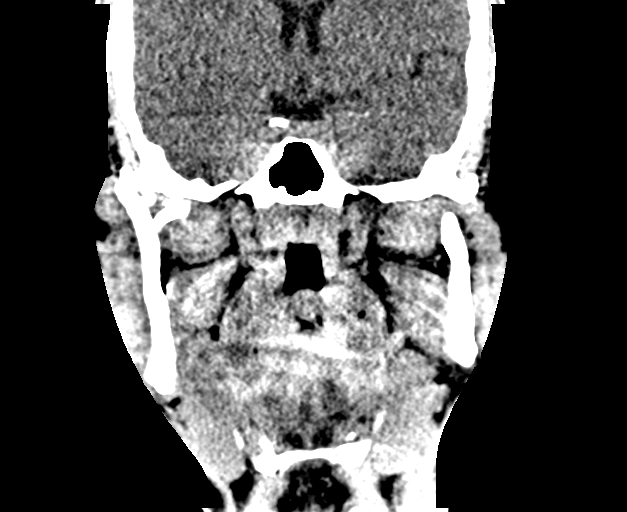

[Series 8: sagittal soft tissue · sagittal · 0.35mm/px · 3 of 95 slices shown]
[im 32/95  brain]
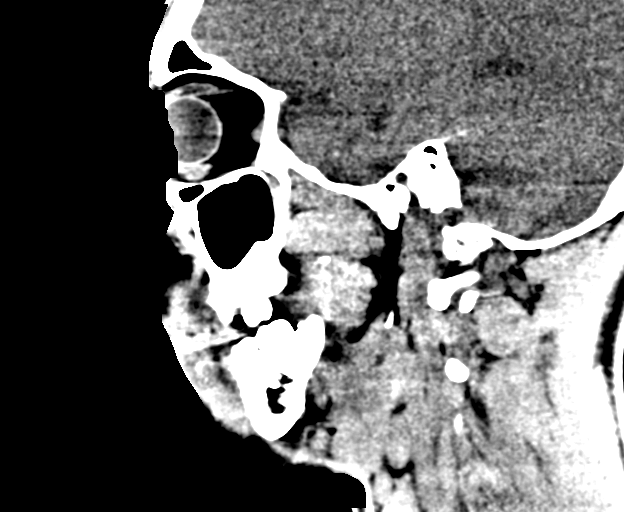
[im 48/95  brain]
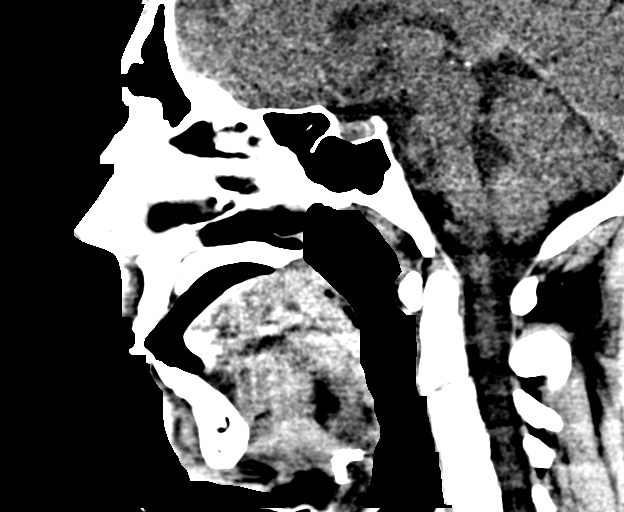
[im 63/95  brain]
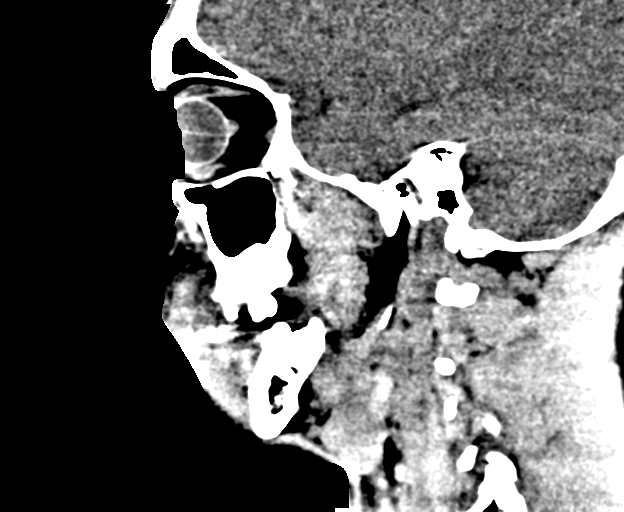

[15 of 47 positions shown; findings below may reference images not displayed]

FINDINGS: 3D rendering of the mandible fractures. Vertical fracture through
the parasymphyseal mandible.

Oblique fracture through the ramus of the LEFT mandible at the base
of the mandibular condyle.

RIGHT mandibular condyles fracture dislocation is not well
appreciated on 3D imaging.
IMPRESSION: 3D rendering of multiple mandibular fractures as described above.

## 2019-07-18 ENCOUNTER — Ambulatory Visit: Payer: No Typology Code available for payment source | Attending: Internal Medicine

## 2019-07-18 DIAGNOSIS — Z20822 Contact with and (suspected) exposure to covid-19: Secondary | ICD-10-CM

## 2019-07-19 LAB — NOVEL CORONAVIRUS, NAA: SARS-CoV-2, NAA: NOT DETECTED
